# Patient Record
Sex: Male | Born: 1970 | Marital: Single | Smoking: Heavy tobacco smoker
Health system: Southern US, Community
[De-identification: ages and names within clinical notes are randomized; demographics above are authoritative.]

---

## 2017-05-12 ENCOUNTER — Encounter: Payer: Self-pay | Admitting: Emergency Medicine

## 2017-05-12 ENCOUNTER — Inpatient Hospital Stay
Admission: EM | Admit: 2017-05-12 | Discharge: 2017-05-14 | DRG: 603 | Disposition: A | Discharge status: Home / Self Care | Payer: BLUE CROSS/BLUE SHIELD | Attending: Internal Medicine | Admitting: Internal Medicine

## 2017-05-12 DIAGNOSIS — L03116 Cellulitis of left lower limb: Secondary | ICD-10-CM | POA: Diagnosis not present

## 2017-05-12 DIAGNOSIS — W57XXXA Bitten or stung by nonvenomous insect and other nonvenomous arthropods, initial encounter: Secondary | ICD-10-CM | POA: Diagnosis present

## 2017-05-12 DIAGNOSIS — F17200 Nicotine dependence, unspecified, uncomplicated: Secondary | ICD-10-CM | POA: Diagnosis present

## 2017-05-12 DIAGNOSIS — R609 Edema, unspecified: Secondary | ICD-10-CM

## 2017-05-12 DIAGNOSIS — Z8489 Family history of other specified conditions: Secondary | ICD-10-CM | POA: Diagnosis not present

## 2017-05-12 DIAGNOSIS — Z79899 Other long term (current) drug therapy: Secondary | ICD-10-CM | POA: Diagnosis not present

## 2017-05-12 DIAGNOSIS — L0390 Cellulitis, unspecified: Secondary | ICD-10-CM | POA: Diagnosis present

## 2017-05-12 LAB — COMPREHENSIVE METABOLIC PANEL
ALK PHOS: 59 U/L (ref 38–126)
ALT: 16 U/L — AB (ref 17–63)
ANION GAP: 6 (ref 5–15)
AST: 18 U/L (ref 15–41)
Albumin: 3.8 g/dL (ref 3.5–5.0)
BILIRUBIN TOTAL: 1 mg/dL (ref 0.3–1.2)
BUN: 9 mg/dL (ref 6–20)
CALCIUM: 8.7 mg/dL — AB (ref 8.9–10.3)
CO2: 27 mmol/L (ref 22–32)
CREATININE: 1.2 mg/dL (ref 0.61–1.24)
Chloride: 101 mmol/L (ref 101–111)
GFR calc Af Amer: >60 mL/min (ref >60)
GFR calc non Af Amer: >60 mL/min (ref >60)
Glucose, Bld: 111 mg/dL — ABNORMAL HIGH (ref 65–99)
Potassium: 4.4 mmol/L (ref 3.5–5.1)
SODIUM: 134 mmol/L — AB (ref 135–145)
Total Protein: 7.8 g/dL (ref 6.5–8.1)

## 2017-05-12 LAB — CBC WITH DIFFERENTIAL/PLATELET
BASOS PCT: 1 %
Basophils Absolute: 0.1 10*3/uL (ref 0–0.1)
EOS ABS: 0 10*3/uL (ref 0–0.7)
Eosinophils Relative: 0 %
HEMATOCRIT: 42.4 % (ref 40.0–52.0)
HEMOGLOBIN: 14.5 g/dL (ref 13.0–18.0)
Lymphocytes Relative: 11 %
Lymphs Abs: 1.5 10*3/uL (ref 1.0–3.6)
MCH: 31.1 pg (ref 26.0–34.0)
MCHC: 34.2 g/dL (ref 32.0–36.0)
MCV: 90.9 fL (ref 80.0–100.0)
Monocytes Absolute: 1.1 10*3/uL — ABNORMAL HIGH (ref 0.2–1.0)
Monocytes Relative: 8 %
NEUTROS ABS: 10.6 10*3/uL — AB (ref 1.4–6.5)
NEUTROS PCT: 80 %
Platelets: 187 10*3/uL (ref 150–440)
RBC: 4.66 MIL/uL (ref 4.40–5.90)
RDW: 13.3 % (ref 11.5–14.5)
WBC: 13.3 10*3/uL — AB (ref 3.8–10.6)

## 2017-05-12 LAB — LACTIC ACID, PLASMA: Lactic Acid, Venous: 1.2 mmol/L (ref 0.5–1.9)

## 2017-05-12 MED ORDER — VANCOMYCIN HCL 10 G IV SOLR
1250.0000 mg | Freq: Two times a day (BID) | INTRAVENOUS | Status: DC
  Administered 2017-05-12 – 2017-05-13 (×3): 1250 mg via INTRAVENOUS
  Filled 2017-05-12 (×5): qty 1250

## 2017-05-12 MED ORDER — ONDANSETRON HCL 4 MG PO TABS
4.0000 mg | ORAL_TABLET | Freq: Four times a day (QID) | ORAL | Status: DC | PRN
Start: 2017-05-12 — End: 2017-05-14

## 2017-05-12 MED ORDER — ACETAMINOPHEN 650 MG RE SUPP: 650.0000 mg | Freq: Four times a day (QID) | RECTAL | Status: DC | PRN

## 2017-05-12 MED ORDER — SODIUM CHLORIDE 0.9 % IV SOLN
Freq: Once | INTRAVENOUS | Status: AC
  Administered 2017-05-12: 17:00:00 via INTRAVENOUS

## 2017-05-12 MED ORDER — LORATADINE 10 MG PO TABS
10.0000 mg | ORAL_TABLET | Freq: Every day | ORAL | Status: DC
  Filled 2017-05-12: qty 1

## 2017-05-12 MED ORDER — ONDANSETRON HCL 4 MG/2ML IJ SOLN: 4.0000 mg | Freq: Four times a day (QID) | INTRAMUSCULAR | Status: DC | PRN

## 2017-05-12 MED ORDER — TRAMADOL HCL 50 MG PO TABS: 50.0000 mg | ORAL_TABLET | Freq: Four times a day (QID) | ORAL | Status: DC | PRN

## 2017-05-12 MED ORDER — ACETAMINOPHEN 325 MG PO TABS: 650.0000 mg | ORAL_TABLET | Freq: Four times a day (QID) | ORAL | Status: DC | PRN

## 2017-05-12 MED ORDER — FLUTICASONE PROPIONATE 50 MCG/ACT NA SUSP
2.0000 | Freq: Every day | NASAL | Status: DC
  Filled 2017-05-12: qty 16

## 2017-05-12 MED ORDER — ENOXAPARIN SODIUM 40 MG/0.4ML ~~LOC~~ SOLN
40.0000 mg | SUBCUTANEOUS | Status: DC
  Administered 2017-05-12 – 2017-05-13 (×2): 40 mg via SUBCUTANEOUS
  Filled 2017-05-12 (×2): qty 0.4

## 2017-05-12 MED ORDER — VANCOMYCIN HCL IN DEXTROSE 1-5 GM/200ML-% IV SOLN
1000.0000 mg | Freq: Once | INTRAVENOUS | Status: AC
  Administered 2017-05-12: 1000 mg via INTRAVENOUS
  Filled 2017-05-12: qty 200

## 2017-05-12 NOTE — Progress Notes (Signed)
ANTIBIOTIC CONSULT NOTE - INITIAL  Pharmacy Consult for Vancomycin  Indication: cellulitis  No Known Allergies  Patient Measurements: Height: 6' (182.9 cm) Weight: 197 lb 9.6 oz (89.6 kg) IBW/kg (Calculated) : 77.6 Adjusted Body Weight: 82.4 kg   Vital Signs: Temp: 100.4 F (38 C) (07/01 2016) Temp Source: Oral (07/01 2016) BP: 138/73 (07/01 2016) Pulse Rate: 67 (07/01 2016) Intake/Output from previous day: No intake/output data recorded. Intake/Output from this shift: Total I/O In: -  Out: 1000 [Urine:1000]  Labs:  Recent Labs  05/12/17 1402  WBC 13.3*  HGB 14.5  PLT 187  CREATININE 1.20   Estimated Creatinine Clearance: 82.6 mL/min (by C-G formula based on SCr of 1.2 mg/dL). No results for input(s): VANCOTROUGH, VANCOPEAK, VANCORANDOM, GENTTROUGH, GENTPEAK, GENTRANDOM, TOBRATROUGH, TOBRAPEAK, TOBRARND, AMIKACINPEAK, AMIKACINTROU, AMIKACIN in the last 72 hours.   Microbiology: No results found for this or any previous visit (from the past 720 hour(s)).  Medical History: History reviewed. No pertinent past medical history.  Medications:  Scheduled:  . enoxaparin (LOVENOX) injection  40 mg Subcutaneous Q24H  . fluticasone  2 spray Each Nare Daily  . loratadine  10 mg Oral Daily   Assessment: CrCl = 82.6 ml/min  Ke = 0.073 hr-1 T1/2 = 9.5 hrs Vd = 62.7 L   Goal of Therapy:  Vancomycin trough level 10-15 mcg/ml  Plan:  Expected duration 7 days with resolution of temperature and/or normalization of WBC   Vancomycin 1 gm IV X 1 given on 7/1 @ 17:00. Vancomycin 1250 mg IV Q12H on 7/1 @ 23:00, ~ 6 hrs after 1st dose (stacked dosing). This pt will reach Css by 7/3 @ 17:00. Will draw 1st trough on 7/3 @ 10:30, which will be approaching Css.   Vernal Hritz D 05/12/2017,9:20 PM

## 2017-05-12 NOTE — ED Notes (Signed)
Pt transported to room 113 att, floor called for arrival

## 2017-05-12 NOTE — ED Triage Notes (Signed)
Patient sustained an insect bite to the left knee 10 days ago, he since has developed a fever, malaise, body aches. Patient was prescribed doxy on Thursday,  but could not tolerate PO.

## 2017-05-12 NOTE — ED Provider Notes (Signed)
[facility] Emergency Department Provider Note       Time seen: ----------------------------------------- 4:48 PM on 05/12/2017 -----------------------------------------     I have reviewed the triage vital signs and the nursing notes.   HISTORY   Chief Complaint Knee Pain    HPI Jaime AntiguaDave Lampert is a 46 y.o. male who presents to the ED for left leg pain and swelling. Patient sustained an insect bite to his left knee approximately 10 days ago, he has subsequently developed fever, malaise, body aches. Patient was prescribed doxycycline on Thursday but has had significant stomach upset since that time. His only complaint is left leg pain, 7 out of 10.   History reviewed. No pertinent past medical history.  There are no active problems to display for this patient.   No past surgical history on file.  Allergies Patient has no allergy information on record.  Social History Social History  Substance Use Topics  . Smoking status: Not on file  . Smokeless tobacco: Not on file  . Alcohol use Not on file    Review of Systems Constitutional: Negative for fever. Cardiovascular: Negative for chest pain. Respiratory: Negative for shortness of breath. Gastrointestinal: Negative for abdominal pain, vomiting and diarrhea. Genitourinary: Negative for dysuria. Musculoskeletal: Positive for left leg pain Skin: Positive for left leg erythema Neurological: Negative for headaches, focal weakness or numbness.  All systems negative/normal/unremarkable except as stated in the HPI  ____________________________________________   PHYSICAL EXAM:  VITAL SIGNS: ED Triage Vitals  Enc Vitals Group     BP 05/12/17 1351 118/69     Pulse Rate 05/12/17 1351 70     Resp 05/12/17 1351 18     Temp 05/12/17 1351 100.2 F (37.9 C)     Temp Source 05/12/17 1351 Oral     SpO2 05/12/17 1351 100 %     Weight 05/12/17 1352 198 lb (89.8 kg)     Height 05/12/17 1352 6' (1.829  m)     Head Circumference --      Peak Flow --      Pain Score 05/12/17 1351 7     Pain Loc --      Pain Edu? --      Excl. in GC? --     Constitutional: Alert and oriented. Well appearing and in no distress. Eyes: Conjunctivae are normal. Normal extraocular movements. ENT   Head: Normocephalic and atraumatic.   Nose: No congestion/rhinnorhea.   Mouth/Throat: Mucous membranes are moist.   Neck: No stridor. Cardiovascular: Normal rate, regular rhythm. No murmurs, rubs, or gallops. Respiratory: Normal respiratory effort without tachypnea nor retractions. Breath sounds are clear and equal bilaterally. No wheezes/rales/rhonchi. Gastrointestinal: Soft and nontender. Normal bowel sounds Musculoskeletal: There is a small possible recent insect bite on the proximal left lower extremity. There is surrounding edema and erythema of the left leg Neurologic:  Normal speech and language. No gross focal neurologic deficits are appreciated.  Skin:  Skin lesion as noted above, edema and erythema of the left leg is noted. Psychiatric: Mood and affect are normal. Speech and behavior are normal.  ____________________________________________  ED COURSE:  Pertinent labs & imaging results that were available during my care of the patient were reviewed by me and considered in my medical decision making (see chart for details). Patient presents for cellulitis, we will assess with labs and imaging as indicated.   Procedures ____________________________________________   LABS (pertinent positives/negatives)  Labs Reviewed  CBC WITH DIFFERENTIAL/PLATELET - Abnormal; Notable for the  following:       Result Value   WBC 13.3 (*)    Neutro Abs 10.6 (*)    Monocytes Absolute 1.1 (*)    All other components within normal limits  COMPREHENSIVE METABOLIC PANEL - Abnormal; Notable for the following:    Sodium 134 (*)    Glucose, Bld 111 (*)    Calcium 8.7 (*)    ALT 16 (*)    All other  components within normal limits   ____________________________________________  FINAL ASSESSMENT AND PLAN  Cellulitis  Plan: Patient's labs were dictated above. Patient had presented for left leg pain and swelling with constitutional symptoms despite being on doxycycline. He would benefit from hospital observation as well as IV antibiotics.   Emily Filbert, MD   Note: This note was generated in part or whole with voice recognition software. Voice recognition is usually quite accurate but there are transcription errors that can and very often do occur. I apologize for any typographical errors that were not detected and corrected.     Emily Filbert, MD 05/12/17 [phone]

## 2017-05-12 NOTE — H&P (Signed)
[facility] [place] [facility]   PATIENT NAME: Devonne Lalani    MR#:  [id number]  DATE OF BIRTH:  Apr 02, 1971  DATE OF ADMISSION:  05/12/2017  PRIMARY CARE PHYSICIAN: Patient, No Pcp Per   REQUESTING/REFERRING PHYSICIAN: Dr. Daryel November  CHIEF COMPLAINT:   Chief Complaint  Patient presents with  . Knee Pain    HISTORY OF PRESENT ILLNESS:  Deep Bonawitz  is a 46 y.o. male with no significant past medical history had a insect bite about 4 days ago to his leg right beneath the left knee. He went to urgent care on 05/10/2017 and was started on oral Bactrim for the same. He took the medication for a couple of days and felt that he had significant GI side effects with nausea and intolerance and stopped it after 2 days. However his induration around the original insect bite area and swelling were significantly increasing. His pain was also worsening and so presented to the emergency room. Had an increased white count of 13 K here. Being admitted for left lower extremity cellulitis failing outpatient antibiotics  PAST MEDICAL HISTORY:  History reviewed. No pertinent past medical history.  PAST SURGICAL HISTORY:  No past surgical history on file.  No past surgeries  SOCIAL HISTORY:   Social History  Substance Use Topics  . Smoking status: Light Tobacco Smoker    Types: Cigars  . Smokeless tobacco: Never Used  . Alcohol use Yes     Comment: occasional beer    FAMILY HISTORY:   Family History  Problem Relation Age of Onset  . Diabetes Mother     DRUG ALLERGIES:  No Known Allergies  REVIEW OF SYSTEMS:   Review of Systems  Constitutional: Positive for chills and malaise/fatigue. Negative for fever and weight loss.  HENT: Negative for ear discharge, ear pain, hearing loss, nosebleeds and tinnitus.   Eyes: Negative for blurred vision, double vision and photophobia.  Respiratory: Negative for cough, hemoptysis, shortness of breath and wheezing.     Cardiovascular: Positive for leg swelling. Negative for chest pain, palpitations and orthopnea.  Gastrointestinal: Positive for nausea. Negative for abdominal pain, constipation, diarrhea, heartburn, melena and vomiting.  Genitourinary: Negative for dysuria, frequency, hematuria and urgency.  Musculoskeletal: Positive for myalgias. Negative for back pain and neck pain.  Skin: Negative for rash.  Neurological: Negative for dizziness, tingling, tremors, sensory change, speech change, focal weakness and headaches.  Endo/Heme/Allergies: Does not bruise/bleed easily.  Psychiatric/Behavioral: Negative for depression.    MEDICATIONS AT HOME:   Prior to Admission medications   Medication Sig Start Date End Date Taking? Authorizing Provider  fluticasone (FLONASE) 50 MCG/ACT nasal spray Place 2 sprays into the nose daily. 03/05/17 03/05/18 Yes [provider]  Loratadine 10 MG CAPS Take 10 mg by mouth daily. 03/05/17 03/05/18 Yes [provider]  sulfamethoxazole-trimethoprim (BACTRIM DS,SEPTRA DS) 800-160 MG tablet Take 1 tablet by mouth 2 (two) times daily. 05/10/17 05/17/17 Yes [provider]      VITAL SIGNS:  Blood pressure (!) 143/83, pulse 66, temperature 100.2 F (37.9 C), temperature source Oral, resp. rate (!) 30, height 6' (1.829 m), weight 89.8 kg (198 lb), SpO2 98 %.  PHYSICAL EXAMINATION:   Physical Exam  GENERAL:  46 y.o.-year-old patient lying in the bed with no acute distress.  EYES: Pupils equal, round, reactive to light and accommodation. No scleral icterus. Extraocular muscles intact.  HEENT: Head atraumatic, normocephalic. Oropharynx and nasopharynx clear.  NECK:  Supple, no jugular  venous distention. No thyroid enlargement, no tenderness.  LUNGS: Normal breath sounds bilaterally, no wheezing, rales,rhonchi or crepitation. No use of accessory muscles of respiration.  CARDIOVASCULAR: S1, S2 normal. No murmurs, rubs, or gallops.  ABDOMEN: Soft,  nontender, nondistended. Bowel sounds present. No organomegaly or mass.  EXTREMITIES: There is the site of bug bite right below the left knee and significant tenderness and induration around the site with swelling of the left leg mostly anteriorly and laterally.  -No pedal edema, cyanosis, or clubbing.  NEUROLOGIC: Cranial nerves II through XII are intact. Muscle strength 5/5 in all extremities. Sensation intact. Gait not checked.  PSYCHIATRIC: The patient is alert and oriented x 3.  SKIN: No obvious rash, lesion, or ulcer.   LABORATORY PANEL:   CBC  Recent Labs Lab 05/12/17 1402  WBC 13.3*  HGB 14.5  HCT 42.4  PLT 187   ------------------------------------------------------------------------------------------------------------------  Chemistries   Recent Labs Lab 05/12/17 1402  NA 134*  K 4.4  CL 101  CO2 27  GLUCOSE 111*  BUN 9  CREATININE 1.20  CALCIUM 8.7*  AST 18  ALT 16*  ALKPHOS 59  BILITOT 1.0   ------------------------------------------------------------------------------------------------------------------  Cardiac Enzymes No results for input(s): TROPONINI in the last 168 hours. ------------------------------------------------------------------------------------------------------------------  RADIOLOGY:  No results found.  EKG:  No orders found for this or any previous visit.  IMPRESSION AND PLAN:   Heriberto AntiguaDave Yearwood  is a 46 y.o. male with no significant past medical history had a insect bite about 4 days ago to his leg beneath the left knee.   #1 left lower extremity cellulitis-following an insect bite. Area is indurated, no abscess noted that needs to be drained. -Keep the leg elevated. Start on vancomycin. -Monitor and change to oral antibiotics in 1-2 days -Follow blood cultures  #2 DVT prophylaxis-on Lovenox    All the records are reviewed and case discussed with ED provider. Management plans discussed with the patient, family and they are in  agreement.  CODE STATUS: Full code  TOTAL TIME TAKING CARE OF THIS PATIENT: 50 minutes.    Beatriz Quintela M.D on 05/12/2017 at 6:18 PM  Between 7am to 6pm - Pager - [phone]  After 6pm go to [email or web address] - password [email or web address]  [facility] [facility]  Office  [phone]  CC: Primary care physician; Patient, No Pcp Per

## 2017-05-13 ENCOUNTER — Inpatient Hospital Stay: Payer: BLUE CROSS/BLUE SHIELD

## 2017-05-13 LAB — CBC
HCT: 40.3 % (ref 40.0–52.0)
Hemoglobin: 13.7 g/dL (ref 13.0–18.0)
MCH: 30.5 pg (ref 26.0–34.0)
MCHC: 33.9 g/dL (ref 32.0–36.0)
MCV: 90 fL (ref 80.0–100.0)
Platelets: 183 10*3/uL (ref 150–440)
RBC: 4.48 MIL/uL (ref 4.40–5.90)
RDW: 13.4 % (ref 11.5–14.5)
WBC: 12.4 10*3/uL — AB (ref 3.8–10.6)

## 2017-05-13 LAB — BASIC METABOLIC PANEL
ANION GAP: 4 — AB (ref 5–15)
BUN: 8 mg/dL (ref 6–20)
CALCIUM: 8.4 mg/dL — AB (ref 8.9–10.3)
CHLORIDE: 104 mmol/L (ref 101–111)
CO2: 27 mmol/L (ref 22–32)
Creatinine, Ser: 1.14 mg/dL (ref 0.61–1.24)
GFR calc non Af Amer: >60 mL/min (ref >60)
GLUCOSE: 98 mg/dL (ref 65–99)
POTASSIUM: 4.1 mmol/L (ref 3.5–5.1)
Sodium: 135 mmol/L (ref 135–145)

## 2017-05-13 MED ORDER — SODIUM CHLORIDE 0.9 % IV SOLN
3.0000 g | Freq: Four times a day (QID) | INTRAVENOUS | Status: DC
  Administered 2017-05-13 – 2017-05-14 (×4): 3 g via INTRAVENOUS
  Filled 2017-05-13 (×7): qty 3

## 2017-05-13 NOTE — Progress Notes (Signed)
[employer] - [place] at [facility]                                                                                                                                                                                  Patient Demographics   Jaime Sampson, is a 46 y.o. male, DOB - 03-23-1971, ZOX:[id number]  Admit date - 05/12/2017   Admitting Physician Enid Baas, MD  Outpatient Primary MD for the patient is Patient, No Pcp Per   LOS - 1  Subjective: Pt Still has significant swelling in the left lower extremity and has swelling. He reports that his brother had a history of DVT in the past    Review of Systems:   CONSTITUTIONAL: No documented fever. No fatigue, weakness. No weight gain, no weight loss.  EYES: No blurry or double vision.  ENT: No tinnitus. No postnasal drip. No redness of the oropharynx.  RESPIRATORY: No cough, no wheeze, no hemoptysis. No dyspnea.  CARDIOVASCULAR: No chest pain. No orthopnea. No palpitations. No syncope.  GASTROINTESTINAL: No nausea, no vomiting or diarrhea. No abdominal pain. No melena or hematochezia.  GENITOURINARY: No dysuria or hematuria.  ENDOCRINE: No polyuria or nocturia. No heat or cold intolerance.  HEMATOLOGY: No anemia. No bruising. No bleeding.  INTEGUMENTARY: Left leg swelling and warmth  MUSCULOSKELETAL: No arthritis. No swelling. No gout.  NEUROLOGIC: No numbness, tingling, or ataxia. No seizure-type activity.  PSYCHIATRIC: No anxiety. No insomnia. No ADD.    Vitals:   Vitals:   05/12/17 1830 05/12/17 1900 05/12/17 2016 05/13/17 0436  BP: 138/71 135/74 138/73 123/61  Pulse: 69 66 67 66  Resp:  (!) 32 20 20  Temp:   (!) 100.4 F (38 C) 99.5 F (37.5 C)  TempSrc:   Oral Oral  SpO2: 98% 95% 100% 97%  Weight:   197 lb 9.6 oz (89.6 kg)   Height:   6' (1.829 m)     Wt Readings from Last 3 Encounters:  05/12/17 197 lb 9.6 oz (89.6 kg)     Intake/Output Summary (Last 24 hours) at 05/13/17 1300 Last data filed  at 05/13/17 1011  Gross per 24 hour  Intake              490 ml  Output             1000 ml  Net             -510 ml    Physical Exam:   GENERAL: Pleasant-appearing in no apparent distress.  HEAD, EYES, EARS, NOSE AND THROAT: Atraumatic, normocephalic. Extraocular muscles are intact. Pupils equal and reactive to light. Sclerae anicteric. No conjunctival injection. No oro-pharyngeal  erythema.  NECK: Supple. There is no jugular venous distention. No bruits, no lymphadenopathy, no thyromegaly.  HEART: Regular rate and rhythm,. No murmurs, no rubs, no clicks.  LUNGS: Clear to auscultation bilaterally. No rales or rhonchi. No wheezes.  ABDOMEN: Soft, flat, nontender, nondistended. Has good bowel sounds. No hepatosplenomegaly appreciated.  EXTREMITIES: Left load is swollen down from the knee. His got pitting edema NEUROLOGIC: The patient is alert, awake, and oriented x3 with no focal motor or sensory deficits appreciated bilaterally.  SKIN: Moist and warm with no rashes appreciated.  Psych: Not anxious, depressed LN: No inguinal LN enlargement    Antibiotics   Anti-infectives    Start     Dose/Rate Route Frequency Ordered Stop   05/13/17 1300  Ampicillin-Sulbactam (UNASYN) 3 g in sodium chloride 0.9 % 100 mL IVPB     3 g 200 mL/hr over 30 Minutes Intravenous Every 6 hours 05/13/17 1239     05/12/17 2300  vancomycin (VANCOCIN) 1,250 mg in sodium chloride 0.9 % 250 mL IVPB     1,250 mg 166.7 mL/hr over 90 Minutes Intravenous Every 12 hours 05/12/17 2116     05/12/17 1700  vancomycin (VANCOCIN) IVPB 1000 mg/200 mL premix     1,000 mg 200 mL/hr over 60 Minutes Intravenous  Once 05/12/17 1652 05/12/17 1819      Medications   Scheduled Meds: . enoxaparin (LOVENOX) injection  40 mg Subcutaneous Q24H  . fluticasone  2 spray Each Nare Daily  . loratadine  10 mg Oral Daily   Continuous Infusions: . ampicillin-sulbactam (UNASYN) IV    . vancomycin Stopped (05/13/17 1232)   PRN  Meds:.acetaminophen **OR** acetaminophen, ondansetron **OR** ondansetron (ZOFRAN) IV, traMADol   Data Review:   Micro Results Recent Results (from the past 240 hour(s))  Blood culture (routine x 2)     Status: None (Preliminary result)   Collection Time: 05/12/17  4:58 PM  Result Value Ref Range Status   Specimen Description BLOOD LEFT ANTECUBITAL  Final   Special Requests   Final    BOTTLES DRAWN AEROBIC AND ANAEROBIC Blood Culture adequate volume   Culture NO GROWTH < 24 HOURS  Final   Report Status PENDING  Incomplete  Blood culture (routine x 2)     Status: None (Preliminary result)   Collection Time: 05/12/17  4:58 PM  Result Value Ref Range Status   Specimen Description BLOOD BLOOD LEFT FOREARM  Final   Special Requests   Final    BOTTLES DRAWN AEROBIC AND ANAEROBIC Blood Culture results may not be optimal due to an excessive volume of blood received in culture bottles   Culture NO GROWTH < 24 HOURS  Final   Report Status PENDING  Incomplete    Radiology Reports No results found.   CBC  Recent Labs Lab 05/12/17 1402 05/13/17 0537  WBC 13.3* 12.4*  HGB 14.5 13.7  HCT 42.4 40.3  PLT 187 183  MCV 90.9 90.0  MCH 31.1 30.5  MCHC 34.2 33.9  RDW 13.3 13.4  LYMPHSABS 1.5  --   MONOABS 1.1*  --   EOSABS 0.0  --   BASOSABS 0.1  --     Chemistries   Recent Labs Lab 05/12/17 1402 05/13/17 0537  NA 134* 135  K 4.4 4.1  CL 101 104  CO2 27 27  GLUCOSE 111* 98  BUN 9 8  CREATININE 1.20 1.14  CALCIUM 8.7* 8.4*  AST 18  --   ALT 16*  --  ALKPHOS 59  --   BILITOT 1.0  --    ------------------------------------------------------------------------------------------------------------------ estimated creatinine clearance is 87 mL/min (by C-G formula based on SCr of 1.14 mg/dL). ------------------------------------------------------------------------------------------------------------------ No results for input(s): HGBA1C in the last 72  hours. ------------------------------------------------------------------------------------------------------------------ No results for input(s): CHOL, HDL, LDLCALC, TRIG, CHOLHDL, LDLDIRECT in the last 72 hours. ------------------------------------------------------------------------------------------------------------------ No results for input(s): TSH, T4TOTAL, T3FREE, THYROIDAB in the last 72 hours.  Invalid input(s): FREET3 ------------------------------------------------------------------------------------------------------------------ No results for input(s): VITAMINB12, FOLATE, FERRITIN, TIBC, IRON, RETICCTPCT in the last 72 hours.  Coagulation profile No results for input(s): INR, PROTIME in the last 168 hours.  No results for input(s): DDIMER in the last 72 hours.  Cardiac Enzymes No results for input(s): CKMB, TROPONINI, MYOGLOBIN in the last 168 hours.  Invalid input(s): CK ------------------------------------------------------------------------------------------------------------------ Invalid input(s): POCBNP    Assessment & Plan  Jaime Sampson  is a 46 y.o. male with no significant past medical history had a insect bite about 4 days ago to his leg beneath the left knee.   #1 left lower extremity cellulitis-following an insect bite. Patient still has significant swelling I will Doppler his leg due to family history of DVT I will add Unasyn to current regimen -Keep the leg elevated.   #2 DVT prophylaxis-on Lovenox       Code Status Orders        Start     Ordered   05/12/17 2005  Full code  Continuous     05/12/17 2004    Code Status History    Date Active Date Inactive Code Status Order ID Comments User Context   This patient has a current code status but no historical code status.           Consults None DVT Prophylaxis  Lovenox   Lab Results  Component Value Date   PLT 183 05/13/2017     Time Spent in minutes   Greater than 50%  of time spent in care coordination and counseling patient regarding the condition and plan of care.   Auburn Bilberry M.D on 05/13/2017 at 1:00 PM  Between 7am to 6pm - Pager - [phone]  After 6pm go to [email or web address] - password [email or web address] [facility]  [facility]   Office  [phone]

## 2017-05-13 NOTE — Progress Notes (Signed)
IV abx given as ordered. +1 edema noted in left lower extremity. Negative lower extremity US. No c/o pain today.

## 2017-05-14 LAB — BASIC METABOLIC PANEL
Anion gap: 5 (ref 5–15)
BUN: 11 mg/dL (ref 6–20)
CO2: 27 mmol/L (ref 22–32)
Calcium: 8.4 mg/dL — ABNORMAL LOW (ref 8.9–10.3)
Chloride: 106 mmol/L (ref 101–111)
Creatinine, Ser: 1.07 mg/dL (ref 0.61–1.24)
GFR calc Af Amer: >60 mL/min (ref >60)
GLUCOSE: 91 mg/dL (ref 65–99)
POTASSIUM: 4 mmol/L (ref 3.5–5.1)
Sodium: 138 mmol/L (ref 135–145)

## 2017-05-14 LAB — VANCOMYCIN, TROUGH: Vancomycin Tr: 8 ug/mL — ABNORMAL LOW (ref 15–20)

## 2017-05-14 MED ORDER — AMOXICILLIN-POT CLAVULANATE 875-125 MG PO TABS: 1.0000 | ORAL_TABLET | Freq: Two times a day (BID) | ORAL | 0 refills | Status: AC

## 2017-05-14 MED ORDER — AMOXICILLIN-POT CLAVULANATE 875-125 MG PO TABS
1.0000 | ORAL_TABLET | Freq: Two times a day (BID) | ORAL | Status: DC
  Administered 2017-05-14: 1 via ORAL
  Filled 2017-05-14: qty 1

## 2017-05-14 NOTE — Progress Notes (Signed)
Discussed discharge instructions and medications with pt. IV removed. All questions addressed. Pt transported home via car by his friend.

## 2017-05-14 NOTE — Discharge Summary (Signed)
[facility] [place] [facility]  Jaime Sampson, [place]46 y.o., DOB 07/28/1971, MRN [id number]. Admission date: 05/12/2017 Discharge Date 05/14/2017 Primary MD Patient, No Pcp Per Admitting Physician Enid Baasadhika Kalisetti, MD  Admission Diagnosis  Cellulitis of left leg [L03.116]  Discharge Diagnosis   Active Problems: Left lower extremity  Cellulitis  Insect bite        Hospital Course patient's 46 year old male with no significant past medical history who had insect bite 4 days ago to his leg on the right beneath the left knee. He went to urgent care and was treated with Bactrim however he could not tolerate the Bactrim. His swelling and induration got worst came back to the emergency room she was admitted for cellulitis. Due to the location of the swelling and family history DVT underwent a Doppler of the lower extremity which showed no DVT. Patient was treated with IV antibiotics his swelling is mostly improved. He is doing much better.             Consults  None  Significant Tests:  See full reports for all details     [place] Venous Img Lower Unilateral Left  Result Date: 05/13/2017 CLINICAL DATA:  Swelling of the left lower extremity since Friday EXAM: LEFT LOWER EXTREMITY VENOUS DOPPLER ULTRASOUND TECHNIQUE: Gray-scale sonography with graded compression, as well as color Doppler and duplex ultrasound were performed to evaluate the lower extremity deep venous systems from the level of the common femoral vein and including the common femoral, femoral, profunda femoral, popliteal and calf veins including the posterior tibial, peroneal and gastrocnemius veins when visible. The superficial great saphenous vein was also interrogated. Spectral Doppler was utilized to evaluate flow at rest and with distal augmentation maneuvers in the common femoral, femoral and popliteal veins. COMPARISON:  None. FINDINGS: Contralateral Common Femoral Vein: Respiratory phasicity is normal and  symmetric with the symptomatic side. No evidence of thrombus. Normal compressibility. Common Femoral Vein: No evidence of thrombus. Normal compressibility, respiratory phasicity and response to augmentation. Saphenofemoral Junction: No evidence of thrombus. Normal compressibility and flow on color Doppler imaging. Profunda Femoral Vein: No evidence of thrombus. Normal compressibility and flow on color Doppler imaging. Femoral Vein: No evidence of thrombus. Normal compressibility, respiratory phasicity and response to augmentation. Popliteal Vein: No evidence of thrombus. Normal compressibility, respiratory phasicity and response to augmentation. Calf Veins: No evidence of thrombus. Normal compressibility and flow on color Doppler imaging. Superficial Great Saphenous Vein: No evidence of thrombus. Normal compressibility and flow on color Doppler imaging. Venous Reflux:  None. Other Findings: There are several enlarged lymph nodes in the left groin, largest measures 2.6 cm. IMPRESSION: No evidence of DVT within the left lower extremity. Electronically Signed   By: Sherian ReinWei-Chen  Lin M.D.   On: 05/13/2017 15:26       Today   Subjective:   Jaime Sampson  patient doing much better left leg swelling much less Objective:   Blood pressure (!) 148/66, pulse 63, temperature 98.6 F (37 C), temperature source Oral, resp. rate 20, height 6' (1.829 m), weight 197 lb 9.6 oz (89.6 kg), SpO2 100 %.  .  Intake/Output Summary (Last 24 hours) at 05/14/17 1159 Last data filed at 05/14/17 0900  Gross per 24 hour  Intake              950 ml  Output                0 ml  Net  950 ml    Exam VITAL SIGNS: Blood pressure (!) 148/66, pulse 63, temperature 98.6 F (37 C), temperature source Oral, resp. rate 20, height 6' (1.829 m), weight 197 lb 9.6 oz (89.6 kg), SpO2 100 %.  GENERAL:  46 y.o.-year-old patient lying in the bed with no acute distress.  EYES: Pupils equal, round, reactive to light and  accommodation. No scleral icterus. Extraocular muscles intact.  HEENT: Head atraumatic, normocephalic. Oropharynx and nasopharynx clear.  NECK:  Supple, no jugular venous distention. No thyroid enlargement, no tenderness.  LUNGS: Normal breath sounds bilaterally, no wheezing, rales,rhonchi or crepitation. No use of accessory muscles of respiration.  CARDIOVASCULAR: S1, S2 normal. No murmurs, rubs, or gallops.  ABDOMEN: Soft, nontender, nondistended. Bowel sounds present. No organomegaly or mass.  EXTREMITIES: No pedal edema, cyanosis, or clubbing.  NEUROLOGIC: Cranial nerves II through XII are intact. Muscle strength 5/5 in all extremities. Sensation intact. Gait not checked.  PSYCHIATRIC: The patient is alert and oriented x 3.  SKIN: No obvious rash, lesion, or ulcer.   Data Review     CBC w Diff: Lab Results  Component Value Date   WBC 12.4 (H) 05/13/2017   HGB 13.7 05/13/2017   HCT 40.3 05/13/2017   PLT 183 05/13/2017   LYMPHOPCT 11 05/12/2017   MONOPCT 8 05/12/2017   EOSPCT 0 05/12/2017   BASOPCT 1 05/12/2017   CMP: Lab Results  Component Value Date   NA 138 05/14/2017   K 4.0 05/14/2017   CL 106 05/14/2017   CO2 27 05/14/2017   BUN 11 05/14/2017   CREATININE 1.07 05/14/2017   PROT 7.8 05/12/2017   ALBUMIN 3.8 05/12/2017   BILITOT 1.0 05/12/2017   ALKPHOS 59 05/12/2017   AST 18 05/12/2017   ALT 16 (L) 05/12/2017  .  Micro Results Recent Results (from the past 240 hour(s))  Blood culture (routine x 2)     Status: None (Preliminary result)   Collection Time: 05/12/17  4:58 PM  Result Value Ref Range Status   Specimen Description BLOOD LEFT ANTECUBITAL  Final   Special Requests   Final    BOTTLES DRAWN AEROBIC AND ANAEROBIC Blood Culture adequate volume   Culture NO GROWTH 2 DAYS  Final   Report Status PENDING  Incomplete  Blood culture (routine x 2)     Status: None (Preliminary result)   Collection Time: 05/12/17  4:58 PM  Result Value Ref Range Status    Specimen Description BLOOD BLOOD LEFT FOREARM  Final   Special Requests   Final    BOTTLES DRAWN AEROBIC AND ANAEROBIC Blood Culture results may not be optimal due to an excessive volume of blood received in culture bottles   Culture NO GROWTH 2 DAYS  Final   Report Status PENDING  Incomplete        Code Status Orders        Start     Ordered   05/12/17 2005  Full code  Continuous     05/12/17 2004    Code Status History    Date Active Date Inactive Code Status Order ID Comments User Context   This patient has a current code status but no historical code status.            Discharge Medications   Allergies as of 05/14/2017   No Known Allergies     Medication List    STOP taking these medications   sulfamethoxazole-trimethoprim 800-160 MG tablet Commonly known as:  BACTRIM DS,SEPTRA DS  TAKE these medications   amoxicillin-clavulanate 875-125 MG tablet Commonly known as:  AUGMENTIN Take 1 tablet by mouth every 12 (twelve) hours.   fluticasone 50 MCG/ACT nasal spray Commonly known as:  FLONASE Place 2 sprays into the nose daily.   Loratadine 10 MG Caps Take 10 mg by mouth daily.          Total Time in preparing paper work, data evaluation and todays exam - 35 minutes  Auburn Bilberry M.D on 05/14/2017 at 11:59 AM  [facility]   Office  [phone]

## 2017-05-14 NOTE — Discharge Instructions (Signed)
° °Cellulitis, Adult °Cellulitis is a skin infection. The infected area is usually red and sore. This condition occurs most often in the arms and lower legs. It is very important to get treated for this condition. °Follow these instructions at home: °· Take over-the-counter and prescription medicines only as told by your doctor. °· If you were prescribed an antibiotic medicine, take it as told by your doctor. Do not stop taking the antibiotic even if you start to feel better. °· Drink enough fluid to keep your pee (urine) clear or pale yellow. °· Do not touch or rub the infected area. °· Raise (elevate) the infected area above the level of your heart while you are sitting or lying down. °· Place warm or cold wet cloths (warm or cold compresses) on the infected area. Do this as told by your doctor. °· Keep all follow-up visits as told by your doctor. This is important. These visits let your doctor make sure your infection is not getting worse. °Contact a doctor if: °· You have a fever. °· Your symptoms do not get better after 1-2 days of treatment. °· Your bone or joint under the infected area starts to hurt after the skin has healed. °· Your infection comes back. This can happen in the same area or another area. °· You have a swollen bump in the infected area. °· You have new symptoms. °· You feel ill and also have muscle aches and pains. °Get help right away if: °· Your symptoms get worse. °· You feel very sleepy. °· You throw up (vomit) or have watery poop (diarrhea) for a long time. °· There are red streaks coming from the infected area. °· Your red area gets larger. °· Your red area turns darker. °This information is not intended to replace advice given to you by your health care provider. Make sure you discuss any questions you have with your health care provider. °Document Released: 04/16/2008 Document Revised: 04/05/2016 Document Reviewed: 09/07/2015 °Elsevier Interactive Patient Education © 2018 [employer] °[facility] -  at [facility] ° °DIET:  °Regular diet ° °DISCHARGE CONDITION:  °Stable ° °ACTIVITY:  °Activity as tolerated ° °OXYGEN:  °Home Oxygen: No. °  °Oxygen Delivery: room air ° °DISCHARGE LOCATION:  °home  ° ° °ADDITIONAL DISCHARGE INSTRUCTION: ° ° °If you experience worsening of your admission symptoms, develop shortness of breath, life threatening emergency, suicidal or homicidal thoughts you must seek medical attention immediately by calling 911 or calling your MD immediately  if symptoms less severe. ° °You Must read complete instructions/literature along with all the possible adverse reactions/side effects for all the Medicines you take and that have been prescribed to you. Take any new Medicines after you have completely understood and accpet all the possible adverse reactions/side effects.  ° °Please note ° °You were cared for by a hospitalist during your hospital stay. If you have any questions about your discharge medications or the care you received while you were in the hospital after you are discharged, you can call the unit and asked to speak with the hospitalist on call if the hospitalist that took care of you is not available. Once you are discharged, your primary care physician will handle any further medical issues. Please note that NO REFILLS for any discharge medications will be authorized once you are discharged, as it is imperative that you return to your primary care physician (or establish a relationship with a primary care physician if you do not have   have one) for your aftercare needs so that they can reassess your need for medications and monitor your lab values.

## 2017-05-17 LAB — CULTURE, BLOOD (ROUTINE X 2)
Culture: NO GROWTH
Culture: NO GROWTH
SPECIAL REQUESTS: ADEQUATE

## 2017-08-27 LAB — HIV ANTIBODY (ROUTINE TESTING W REFLEX): HIV Screen 4th Generation wRfx: NONREACTIVE

## 2018-04-25 IMAGING — US US EXTREM LOW VENOUS*L*
1 series · 13 of 24 positions shown · non-contrast
Comparison: None.

CLINICAL DATA: Swelling of the left lower extremity since [REDACTED]



[Series 1: us extrem low venous*left* · 0.07mm/px · 13 of 40 slices shown]
[im 1/40]
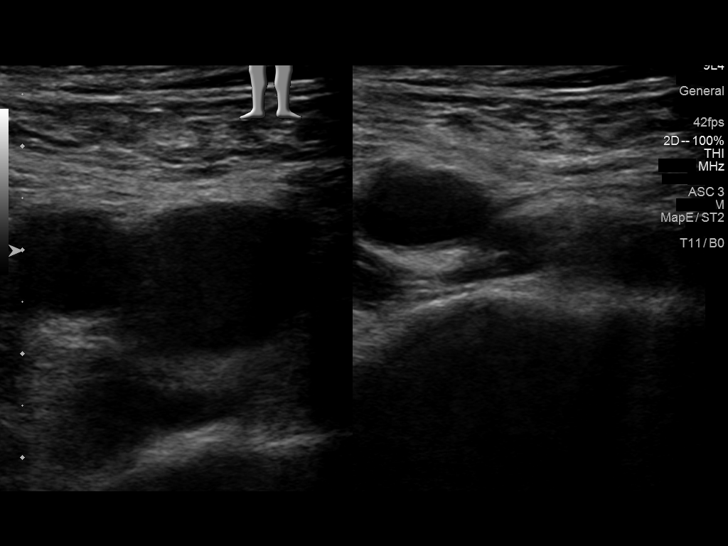
[im 4/40]
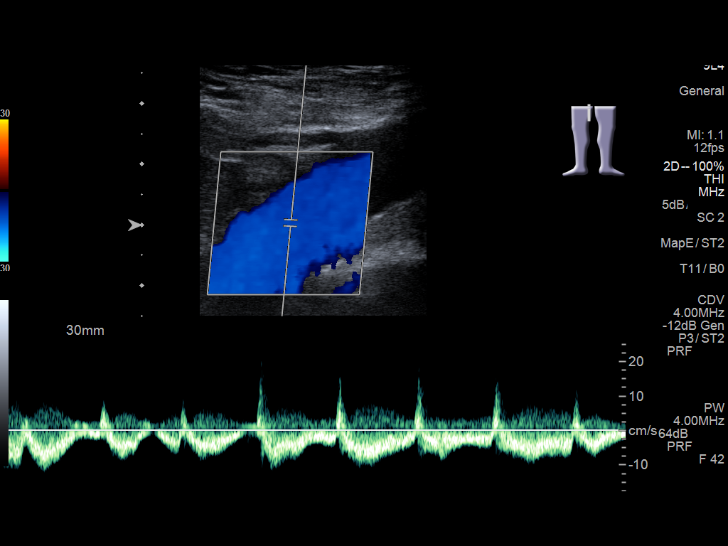
[im 7/40]
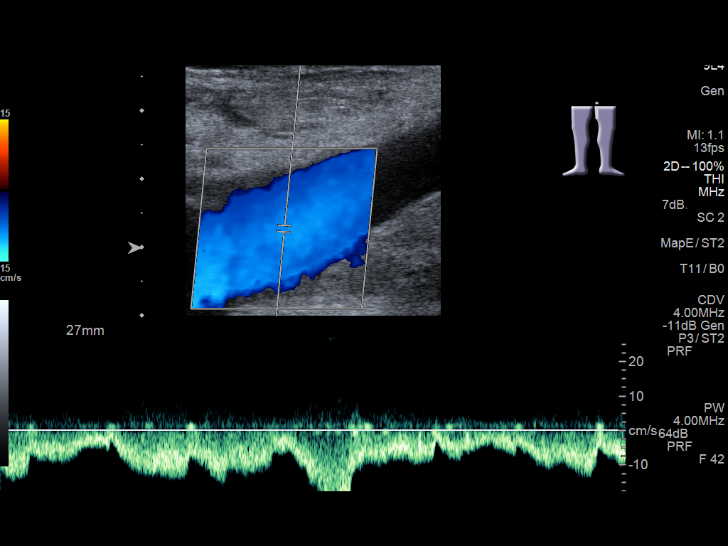
[im 11/40]
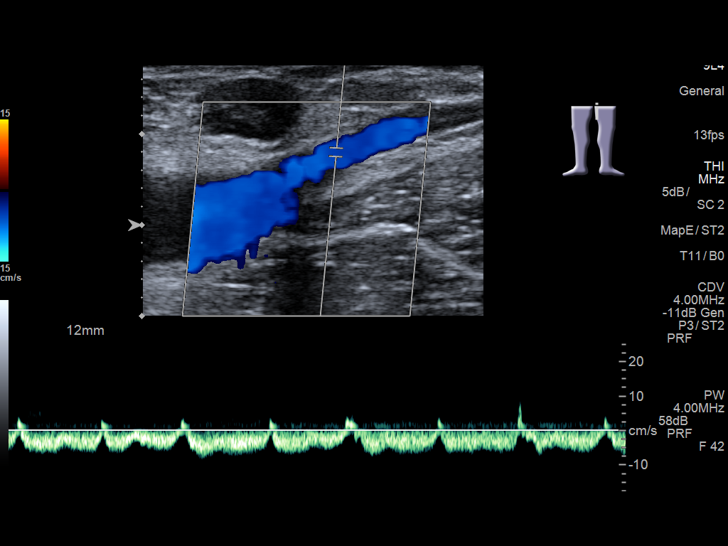
[im 14/40]
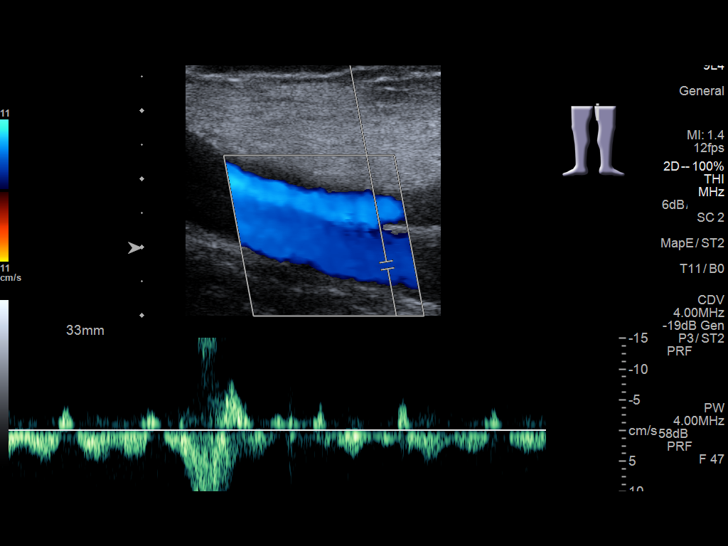
[im 17/40]
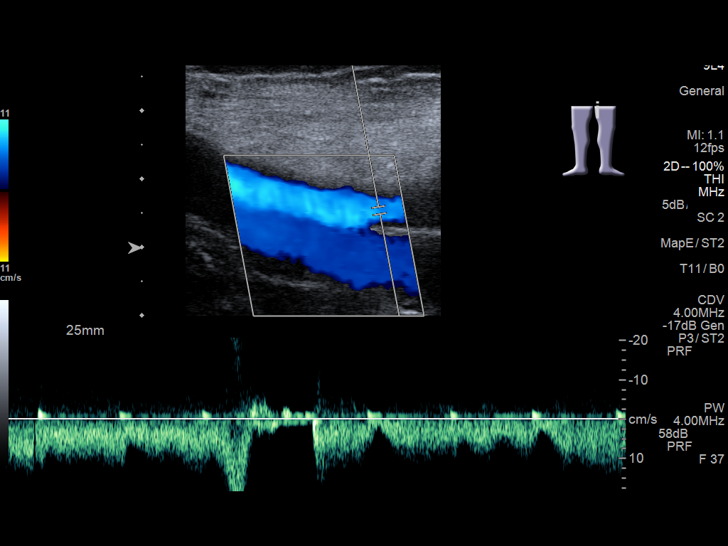
[im 21/40]
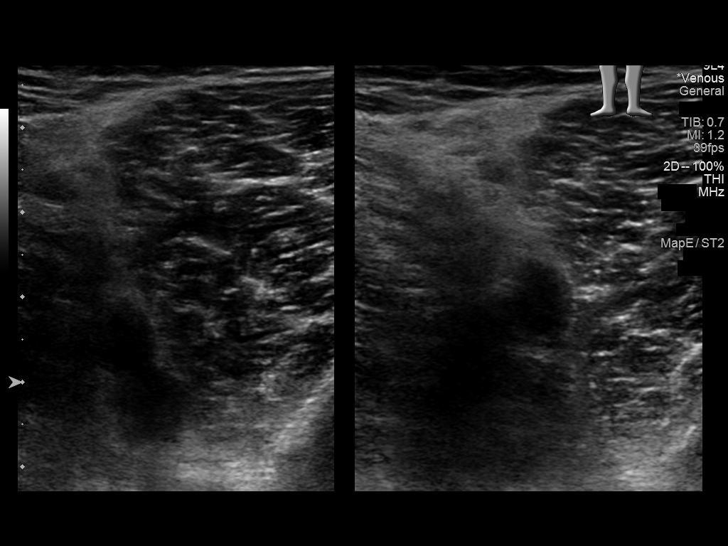
[im 23/40]
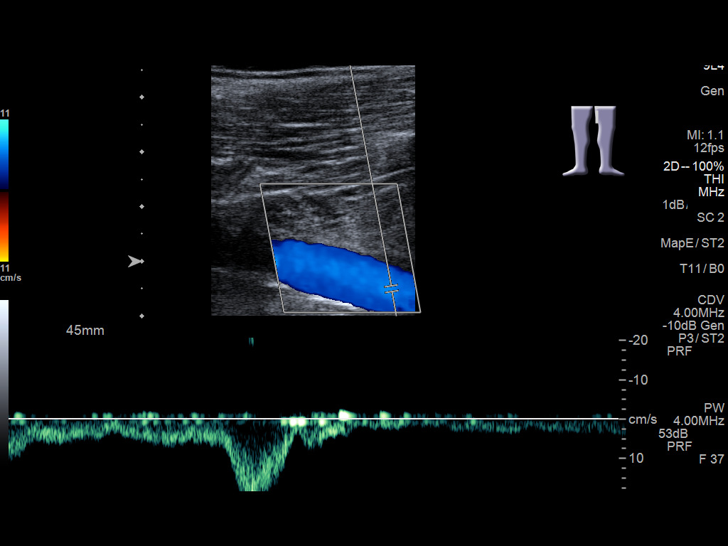
[im 26/40]
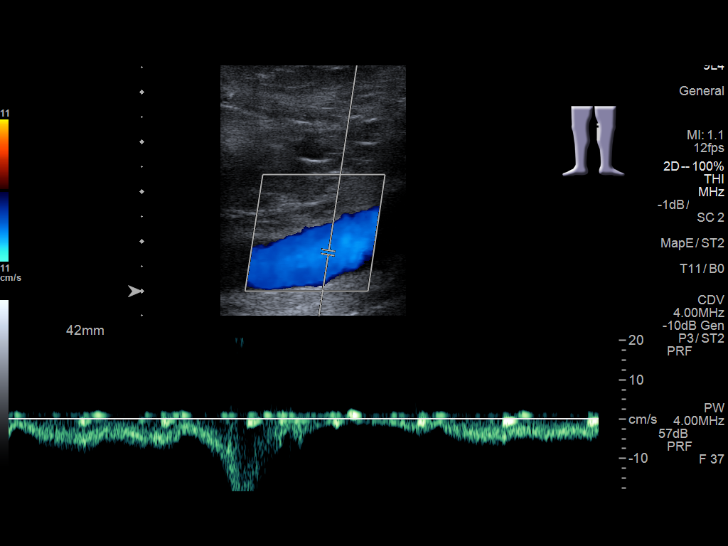
[im 29/40]
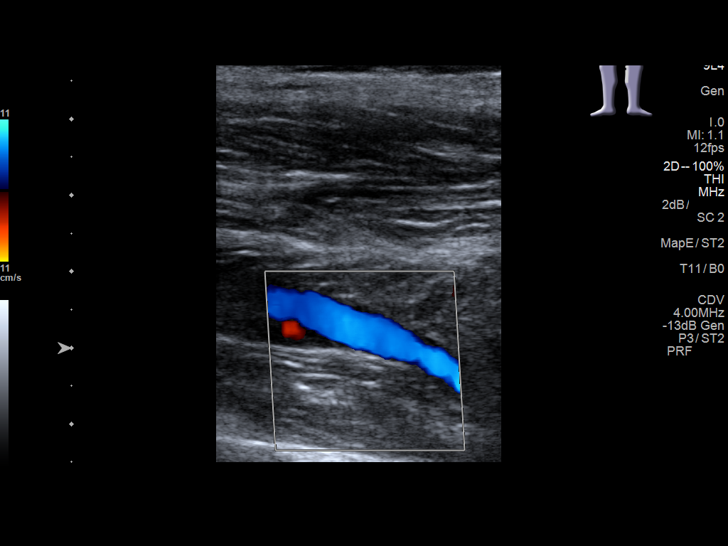
[im 33/40]
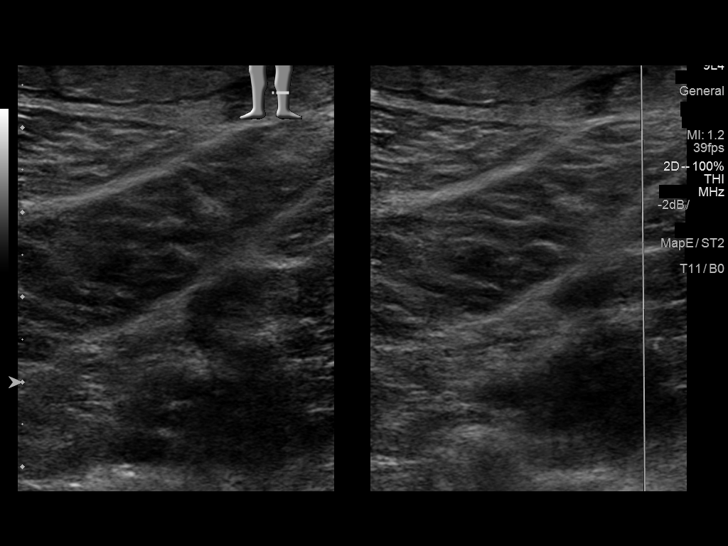
[im 36/40]
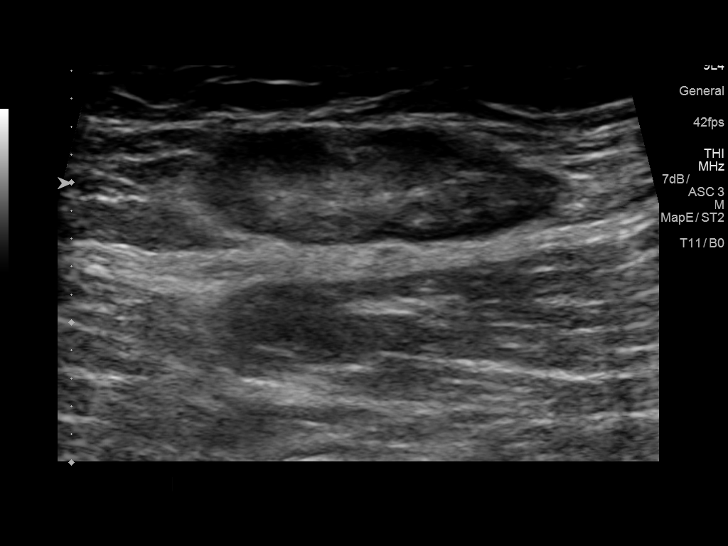
[im 40/40]
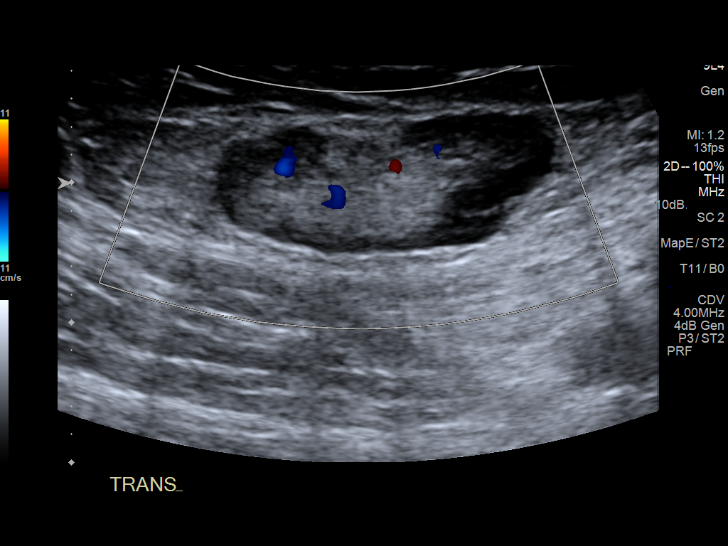

[13 of 24 positions shown; findings below may reference images not displayed]

FINDINGS: Contralateral Common Femoral Vein: Respiratory phasicity is normal
and symmetric with the symptomatic side. No evidence of thrombus.
Normal compressibility.

Common Femoral Vein: No evidence of thrombus. Normal
compressibility, respiratory phasicity and response to augmentation.

Saphenofemoral Junction: No evidence of thrombus. Normal
compressibility and flow on color Doppler imaging.

Profunda Femoral Vein: No evidence of thrombus. Normal
compressibility and flow on color Doppler imaging.

Femoral Vein: No evidence of thrombus. Normal compressibility,
respiratory phasicity and response to augmentation.

Popliteal Vein: No evidence of thrombus. Normal compressibility,
respiratory phasicity and response to augmentation.

Calf Veins: No evidence of thrombus. Normal compressibility and flow
on color Doppler imaging.

Superficial Great Saphenous Vein: No evidence of thrombus. Normal
compressibility and flow on color Doppler imaging.

Venous Reflux:  None.

Other Findings: There are several enlarged lymph nodes in the left
groin, largest measures 2.6 cm.
IMPRESSION: No evidence of DVT within the left lower extremity.

## 2020-02-19 ENCOUNTER — Ambulatory Visit: Payer: Self-pay | Attending: Internal Medicine
